# Patient Record
Sex: Male | Born: 1995 | Race: White | Hispanic: No | Marital: Single | Smoking: Never smoker
Health system: Southern US, Community
[De-identification: ages and names within clinical notes are randomized; demographics above are authoritative.]

## PROBLEM LIST (undated history)

## (undated) DIAGNOSIS — T7840XA Allergy, unspecified, initial encounter: Secondary | ICD-10-CM

## (undated) HISTORY — DX: Allergy, unspecified, initial encounter: T78.40XA

---

## 2014-09-09 ENCOUNTER — Ambulatory Visit (INDEPENDENT_AMBULATORY_CARE_PROVIDER_SITE_OTHER): Payer: Self-pay | Admitting: Urgent Care

## 2014-09-09 VITALS — BP 118/70 | HR 78 | Temp 98.1°F | Resp 18 | Ht 72.0 in | Wt 145.0 lb

## 2014-09-09 DIAGNOSIS — J029 Acute pharyngitis, unspecified: Secondary | ICD-10-CM

## 2014-09-09 DIAGNOSIS — R05 Cough: Secondary | ICD-10-CM

## 2014-09-09 DIAGNOSIS — J309 Allergic rhinitis, unspecified: Secondary | ICD-10-CM

## 2014-09-09 DIAGNOSIS — R059 Cough, unspecified: Secondary | ICD-10-CM

## 2014-09-09 MED ORDER — HYDROCODONE-HOMATROPINE 5-1.5 MG/5ML PO SYRP: 5.0000 mL | ORAL_SOLUTION | Freq: Every evening | ORAL | Status: DC | PRN

## 2014-09-09 MED ORDER — FLUTICASONE PROPIONATE 50 MCG/ACT NA SUSP: 2.0000 | Freq: Every day | NASAL | Status: DC

## 2014-09-09 NOTE — Progress Notes (Signed)
    MRN: [id number] DOB: February 26, 1996  Subjective:   Douglas Burke is a 19 y.o. male presenting for chief complaint of Sore Throat  Reports 2 week history of throat pain and mild dry cough, chest congestion. Has tried chloroseptic spray, cough drops, Alleve with minimal relief. Denies Sinus congestion, sinus pain, red eyes, ear fullness, ear pain, wheezing, shortness of breath, chest tightness and chest pain, fever, fatigue, nausea, vomiting, abdominal pain and diarrhea. Has had 2 sick contacts with cold like symptoms, they are now improved. Has a history of seasonal allergies, takes Claritin intermittently for this. Denies history of asthma. Patient did not take flu shot this season. Denies smoking, occasional alcohol drink. Denies any other aggravating or relieving factors, no other questions or concerns.  Douglas Burke has a current medication list which includes the following prescription(s): loratadine. He has No Known Allergies.  Douglas Burke  has a past medical history of Allergy. Also  has no past surgical history on file.  ROS As in subjective.  Objective:   Vitals: BP 118/70 mmHg  Pulse 78  Temp(Src) 98.1 F (36.7 C) (Oral)  Resp 18  Ht 6' (1.829 m)  Wt 145 lb (65.772 kg)  BMI 19.66 kg/m2  SpO2 98%  Physical Exam  Constitutional: He is well-developed, well-nourished, and in no distress.  HENT:  TM's intact bilaterally, no effusions or erythema. Nasal turbinates boggy. No sinus tenderness. Postnasal drip present, without oropharyngeal exudates, erythema or abscesses.   Eyes: Conjunctivae are normal. Right eye exhibits no discharge. Left eye exhibits no discharge. No scleral icterus.  Cardiovascular: Normal rate, regular rhythm and intact distal pulses.  Exam reveals no gallop and no friction rub.   No murmur heard. Pulmonary/Chest: No stridor. No respiratory distress. He has no wheezes. He has no rales. He exhibits no tenderness.  Lymphadenopathy:    He has no cervical  adenopathy.   Assessment and Plan :   1. Allergic rhinitis, unspecified allergic rhinitis type 2. Cough 3. Sore throat - suggested he restart his allergy medication, offered supportive care - Return to clinic in one week if symptoms don't improve or worsen  Wallis BambergMario Abby Stines, PA-C [facility] [phone] 09/09/2014 11:23 AM

## 2014-09-09 NOTE — Patient Instructions (Signed)

## 2015-08-15 ENCOUNTER — Encounter (HOSPITAL_COMMUNITY): Payer: Self-pay | Admitting: *Deleted

## 2015-08-15 ENCOUNTER — Emergency Department (HOSPITAL_COMMUNITY): Payer: Self-pay

## 2015-08-15 ENCOUNTER — Emergency Department (HOSPITAL_COMMUNITY)
Admission: EM | Admit: 2015-08-15 | Discharge: 2015-08-15 | Disposition: A | Discharge status: Home / Self Care | Payer: Self-pay | Attending: Emergency Medicine | Admitting: Emergency Medicine

## 2015-08-15 DIAGNOSIS — K920 Hematemesis: Secondary | ICD-10-CM | POA: Insufficient documentation

## 2015-08-15 DIAGNOSIS — R112 Nausea with vomiting, unspecified: Secondary | ICD-10-CM

## 2015-08-15 DIAGNOSIS — K922 Gastrointestinal hemorrhage, unspecified: Secondary | ICD-10-CM

## 2015-08-15 DIAGNOSIS — R109 Unspecified abdominal pain: Secondary | ICD-10-CM | POA: Insufficient documentation

## 2015-08-15 LAB — COMPREHENSIVE METABOLIC PANEL
ALBUMIN: 5.3 g/dL — AB (ref 3.5–5.0)
ALK PHOS: 44 U/L (ref 38–126)
ALT: 25 U/L (ref 17–63)
ANION GAP: 13 (ref 5–15)
AST: 26 U/L (ref 15–41)
BUN: 21 mg/dL — ABNORMAL HIGH (ref 6–20)
CHLORIDE: 104 mmol/L (ref 101–111)
CO2: 23 mmol/L (ref 22–32)
Calcium: 9.7 mg/dL (ref 8.9–10.3)
Creatinine, Ser: 1.07 mg/dL (ref 0.61–1.24)
GFR calc Af Amer: >60 mL/min (ref >60)
GFR calc non Af Amer: >60 mL/min (ref >60)
GLUCOSE: 134 mg/dL — AB (ref 65–99)
POTASSIUM: 4.2 mmol/L (ref 3.5–5.1)
SODIUM: 140 mmol/L (ref 135–145)
Total Bilirubin: 1.6 mg/dL — ABNORMAL HIGH (ref 0.3–1.2)
Total Protein: 9 g/dL — ABNORMAL HIGH (ref 6.5–8.1)

## 2015-08-15 LAB — OCCULT BLOOD GASTRIC / DUODENUM (SPECIMEN CUP): Occult Blood, Gastric: POSITIVE — AB

## 2015-08-15 LAB — CBC
HCT: 51.9 % (ref 39.0–52.0)
HCT: 50.1 % (ref 39.0–52.0)
HEMOGLOBIN: 17.3 g/dL — AB (ref 13.0–17.0)
Hemoglobin: 17.7 g/dL — ABNORMAL HIGH (ref 13.0–17.0)
MCH: 30.1 pg (ref 26.0–34.0)
MCH: 30.1 pg (ref 26.0–34.0)
MCHC: 34.1 g/dL (ref 30.0–36.0)
MCHC: 34.5 g/dL (ref 30.0–36.0)
MCV: 88.3 fL (ref 78.0–100.0)
MCV: 87.3 fL (ref 78.0–100.0)
PLATELETS: 216 10*3/uL (ref 150–400)
Platelets: 209 10*3/uL (ref 150–400)
RBC: 5.88 MIL/uL — ABNORMAL HIGH (ref 4.22–5.81)
RBC: 5.74 MIL/uL (ref 4.22–5.81)
RDW: 12.2 % (ref 11.5–15.5)
RDW: 12.2 % (ref 11.5–15.5)
WBC: 18.9 10*3/uL — AB (ref 4.0–10.5)
WBC: 20.4 10*3/uL — ABNORMAL HIGH (ref 4.0–10.5)

## 2015-08-15 LAB — TYPE AND SCREEN
ABO/RH(D): A POS
ANTIBODY SCREEN: NEGATIVE

## 2015-08-15 LAB — ABO/RH: ABO/RH(D): A POS

## 2015-08-15 MED ORDER — ONDANSETRON HCL 4 MG/2ML IJ SOLN
4.0000 mg | Freq: Once | INTRAMUSCULAR | Status: AC
  Administered 2015-08-15: 4 mg via INTRAVENOUS
  Filled 2015-08-15: qty 2

## 2015-08-15 MED ORDER — FAMOTIDINE IN NACL 20-0.9 MG/50ML-% IV SOLN
20.0000 mg | Freq: Once | INTRAVENOUS | Status: AC
  Administered 2015-08-15: 20 mg via INTRAVENOUS
  Filled 2015-08-15: qty 50

## 2015-08-15 MED ORDER — SODIUM CHLORIDE 0.9 % IV BOLUS (SEPSIS)
1000.0000 mL | Freq: Once | INTRAVENOUS | Status: AC
  Administered 2015-08-15: 1000 mL via INTRAVENOUS

## 2015-08-15 MED ORDER — PANTOPRAZOLE SODIUM 20 MG PO TBEC: 20.0000 mg | DELAYED_RELEASE_TABLET | Freq: Every day | ORAL | Status: AC

## 2015-08-15 MED ORDER — ONDANSETRON HCL 4 MG PO TABS: 4.0000 mg | ORAL_TABLET | Freq: Four times a day (QID) | ORAL | Status: AC

## 2015-08-15 MED ORDER — IOPAMIDOL (ISOVUE-300) INJECTION 61%
INTRAVENOUS | Status: AC
  Administered 2015-08-15: 100 mL
  Filled 2015-08-15: qty 100

## 2015-08-15 NOTE — ED Provider Notes (Signed)
Care assumed from Dr. Karma GanjaLinker. CT scan does not show any appendicitis or other acute pathology. No further vomiting in the ED. He is tolerating by mouth.  Vitals are stable. Abdomen soft and nontender. Patient is anxious to go home.  We'll start PPI. Advised to avoid alcohol, NSAIDs, caffeine. Establish care with PCP and GI. Return precautions discussed.  BP 114/62 mmHg  Pulse 53  Temp(Src) 97.7 F (36.5 C) (Oral)  Resp 11  Wt 142 lb (64.411 kg)  SpO2 96%   Glynn OctaveStephen Vasilisa Vore, MD 08/15/15 1131

## 2015-08-15 NOTE — ED Notes (Signed)
Provided PT with water and crackers

## 2015-08-15 NOTE — ED Notes (Signed)
Pt states he did not have blood in his BMs, just a mix of bright red and coffee ground emesis

## 2015-08-15 NOTE — ED Provider Notes (Signed)
CSN: [id number]     Arrival date & time 08/15/15  0236 History   First MD Initiated Contact with Patient 08/15/15 0500     Chief Complaint  Patient presents with  . GI Bleeding     (Consider location/radiation/quality/duration/timing/severity/associated sxs/prior Treatment) HPI  Pt presenting with c/o acute onset of vomiting- he states he vomited more than 10 times.  He saw coffe ground and bright red blood.  Currently in the ED he states he has continued to vomit and now there is no longer any blood in his vomit.  He states now the vomit appears more yellowish in color.  Some diarrhea- denies blood in stool to me, no melena.  C/o diffuse abdominal soreness.  No abdominal pain prior to vomiting.  Ate chicken alfredo tonight- friend with him ate the same and is not sick.  No fever/chills.  No fainting or dizziness.  No sick contacts.  He has not had hx of similar symptoms in the past.  No treatment prior to arrival.  There are no other associated systemic symptoms, there are no other alleviating or modifying factors.   Past Medical History  Diagnosis Date  . Allergy    History reviewed. No pertinent past surgical history. No family history on file. Social History  Substance Use Topics  . Smoking status: Never Smoker   . Smokeless tobacco: None  . Alcohol Use: 0.0 oz/week    0 Standard drinks or equivalent per week     Comment: occ    Review of Systems  ROS reviewed and all otherwise negative except for mentioned in HPI    Allergies  Review of patient's allergies indicates no known allergies.  Home Medications   Prior to Admission medications   Medication Sig Start Date End Date Taking? Authorizing Provider  ondansetron (ZOFRAN) 4 MG tablet Take 1 tablet (4 mg total) by mouth every 6 (six) hours. 08/15/15   Jerelyn Scott, MD  pantoprazole (PROTONIX) 20 MG tablet Take 1 tablet (20 mg total) by mouth daily. 08/15/15   Jerelyn Scott, MD   BP 107/62 mmHg  Pulse 78  Temp(Src) 97.7 F  (36.5 C) (Oral)  Resp 16  Wt 64.411 kg  SpO2 97%  Vitals reviewed Physical Exam  Physical Examination: General appearance - alert, well appearing, and in no distress Mental status - alert, oriented to person, place, and time Eyes - no conjunctival injection, no scleral icterus Mouth - mucous membranes moist, pharynx normal without lesions Chest - clear to auscultation, no wheezes, rales or rhonchi, symmetric air entry Heart - normal rate, regular rhythm, normal S1, S2, no murmurs, rubs, clicks or gallops Abdomen - soft, diffuse mild tenderness to palpation, no gaurding or rebound tenderness, nondistended, no masses or organomegaly Neurological - alert, oriented, normal speech Extremities - peripheral pulses normal, no pedal edema, no clubbing or cyanosis Skin - normal coloration and turgor, no rashes  ED Course  Procedures (including critical care time) Labs Review Labs Reviewed  COMPREHENSIVE METABOLIC PANEL - Abnormal; Notable for the following:    Glucose, Bld 134 (*)    BUN 21 (*)    Total Protein 9.0 (*)    Albumin 5.3 (*)    Total Bilirubin 1.6 (*)    All other components within normal limits  CBC - Abnormal; Notable for the following:    WBC 18.9 (*)    RBC 5.88 (*)    Hemoglobin 17.7 (*)    All other components within normal limits  CBC - Abnormal;  Notable for the following:    WBC 20.4 (*)    Hemoglobin 17.3 (*)    All other components within normal limits  OCCULT BLOOD GASTRIC / DUODENUM (SPECIMEN CUP) - Abnormal; Notable for the following:    Occult Blood, Gastric POSITIVE (*)    All other components within normal limits  POCT GASTRIC OCCULT BLOOD (1-CARD TO LAB)  TYPE AND SCREEN  ABO/RH    Imaging Review No results found. I have personally reviewed and evaluated these images and lab results as part of my medical decision-making.   EKG Interpretation None      MDM   Final diagnoses:  Nausea and vomiting, vomiting of unspecified type  Upper GI  bleed  Abdominal pain, unspecified abdominal location    Pt presenting with c/o acute onset of vomiting- saw some blood in vomit, then blood cleared.  He has had vomiting in the ED that is yellowish in color- no gross blood.    7:09 AM pt has been able to tolerate po fluids.  hgb maintained on recheck.  No further vomiting.  Will give rx for zofran and rx for pantoprazole.    7:44 AM on recheck pt had vomiting after his po fluids, abdomen is more tender, pt c/o periumblicial pain.  Will keep NPO for now- obtain abdominal CT scan.  Pt signed out to Dr. Manus Gunningancour pending abdominal CT, further treatment of emesis and reassesment.    Jerelyn ScottMartha Linker, MD 08/15/15 [phone]

## 2015-08-15 NOTE — ED Notes (Signed)
Pt is here and states that he has been throwing up blood tonite that was coffee ground material.  Pooped out blood too.  Pt reports abdominal pain.

## 2015-08-15 NOTE — Discharge Instructions (Signed)
Return to the ED with any concerns including vomiting and not able to keep down liquids, continued bleeding in vomit, worsening abdominal pain, fainting, dizziness, decreased level of alertness/lethargy, or any other alarming symptoms

## 2015-08-15 NOTE — ED Notes (Signed)
Pt vomited during assessment. Small amount, no visible blood noted.

## 2015-08-15 NOTE — ED Notes (Signed)
Pt taken to CT scan.

## 2017-06-18 IMAGING — CT CT ABD-PELV W/ CM
2 of 4 series · 16 of 46 positions shown, 18 images · IV contrast (Omni 300)
Comparison: None.

CLINICAL DATA: Generalized abdominal pain, vomiting, nausea.

EXAM:
CT ABDOMEN AND PELVIS WITH CONTRAST
TECHNIQUE: Multidetector CT imaging of the abdomen and pelvis was performed
using the standard protocol following bolus administration of
intravenous contrast.
CONTRAST:  100 mL JO1SF7-FHH IOPAMIDOL (JO1SF7-FHH) INJECTION

[Series 2: a/p w/ 5mm · axial · 0.71mm/px · z∈[+711,+1146]mm · 13 of 95 slices shown, 15 images]
[im 4/95  soft-tissue]
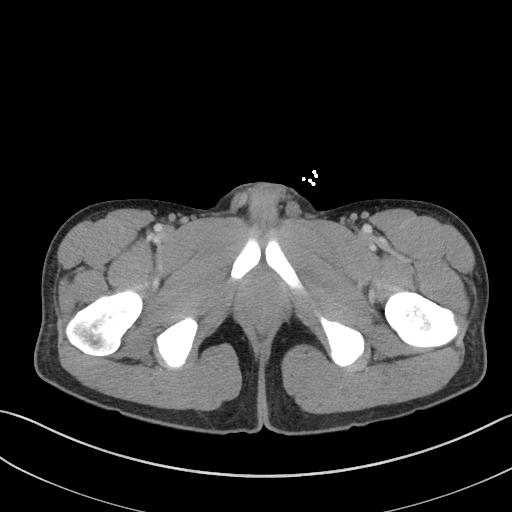
[im 4/95  bone]
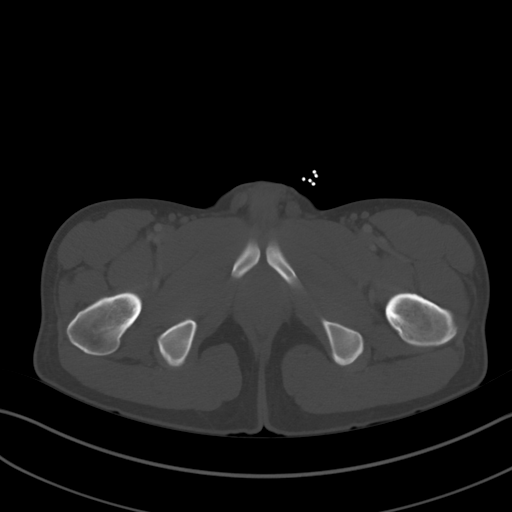
[im 12/95  soft-tissue]
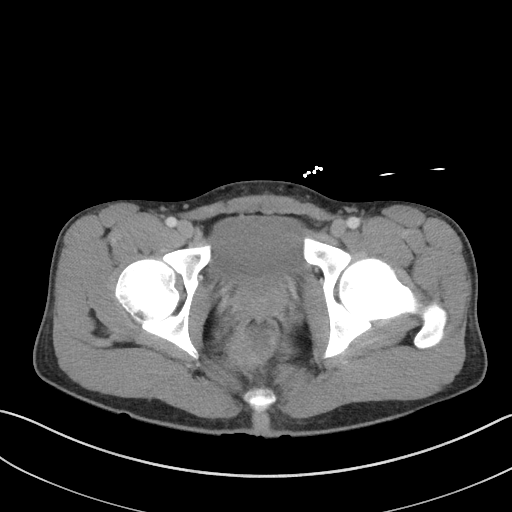
[im 20/95  soft-tissue]
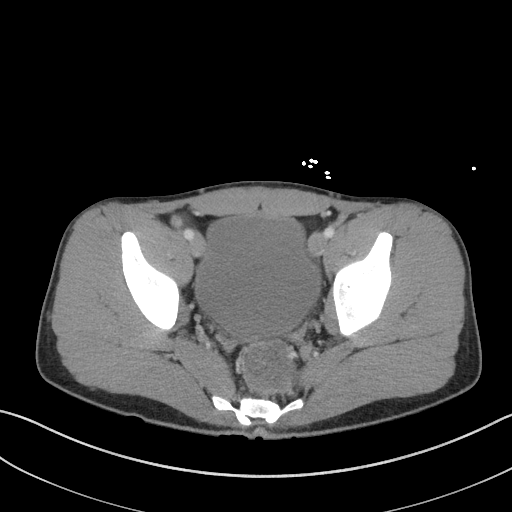
[im 28/95  soft-tissue]
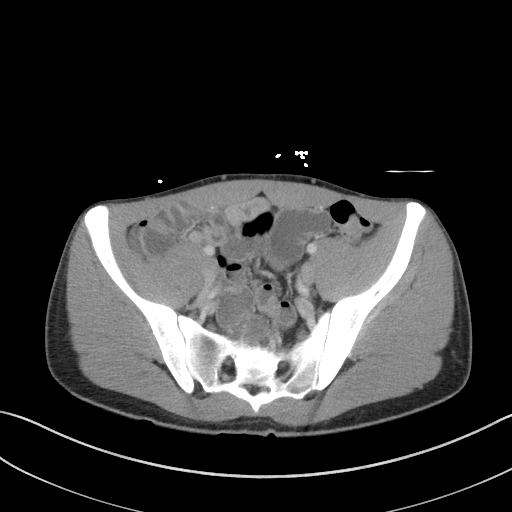
[im 32/95  soft-tissue]
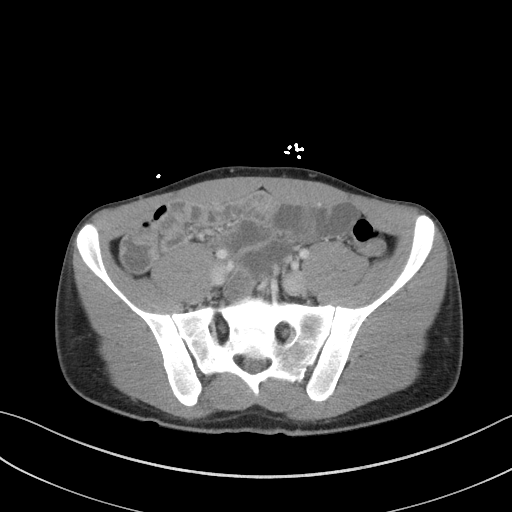
[im 40/95  soft-tissue]
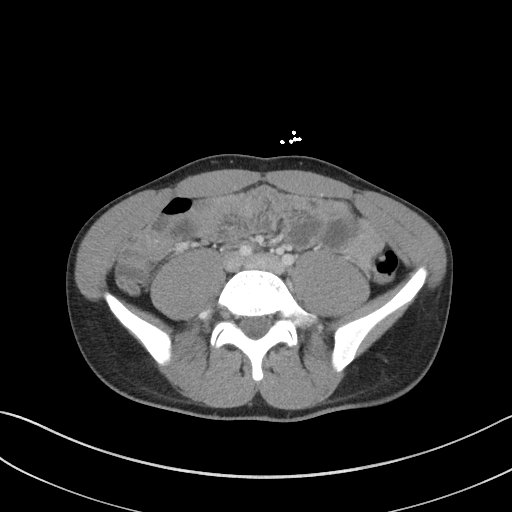
[im 48/95  soft-tissue]
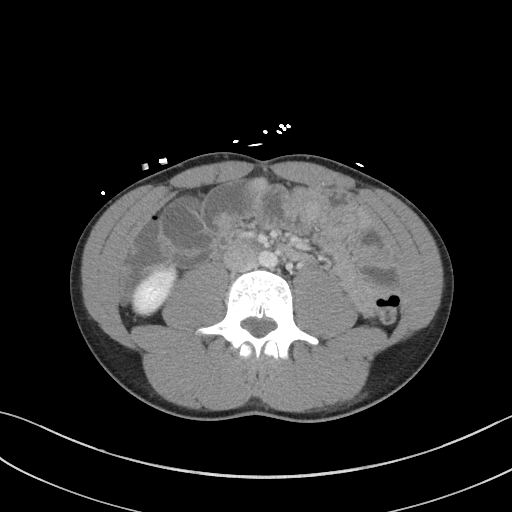
[im 55/95  soft-tissue]
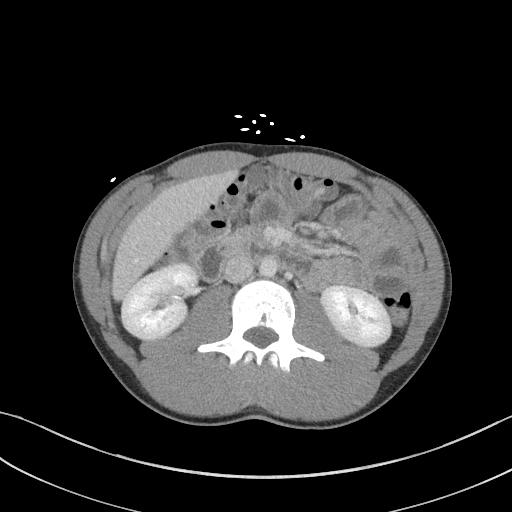
[im 63/95  soft-tissue]
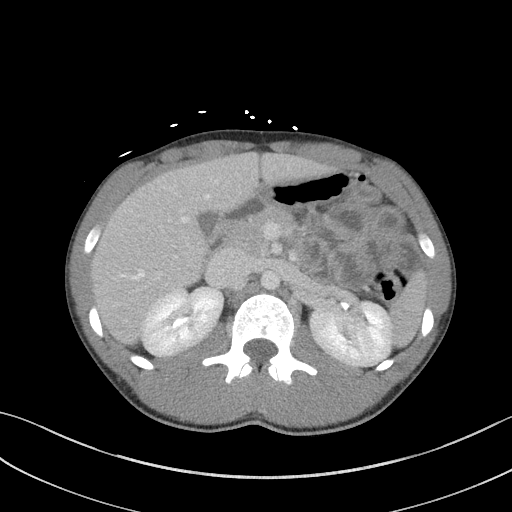
[im 63/95  bone]
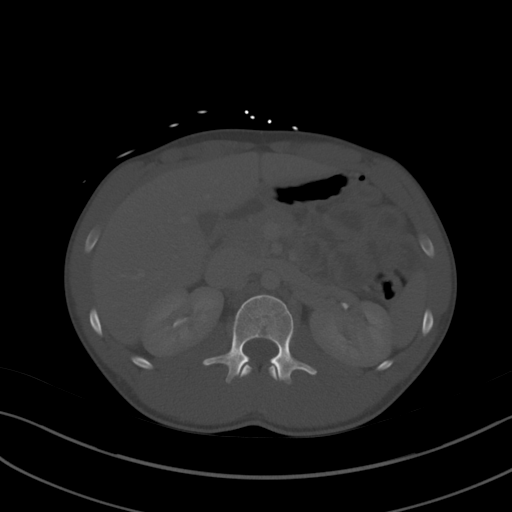
[im 67/95  soft-tissue]
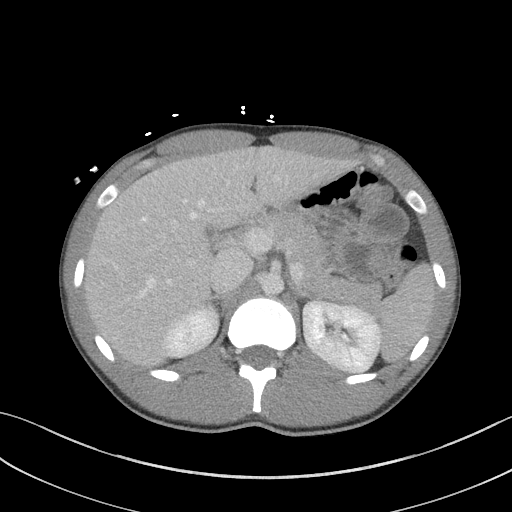
[im 75/95  soft-tissue]
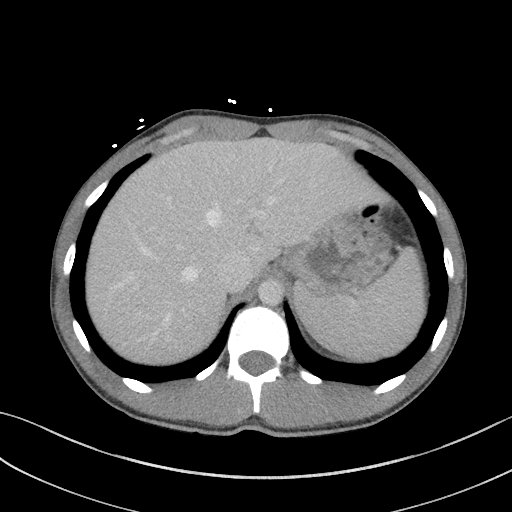
[im 83/95  soft-tissue]
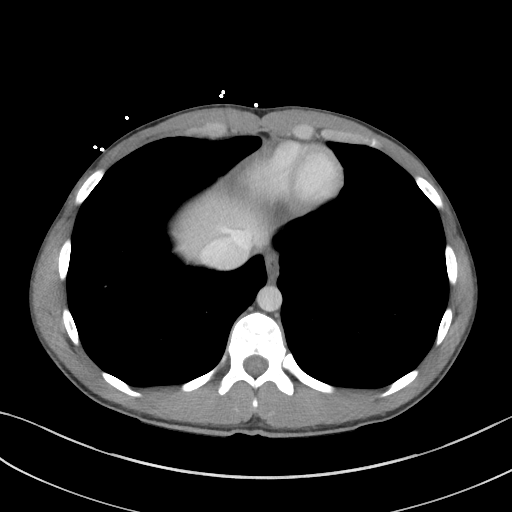
[im 91/95  soft-tissue]
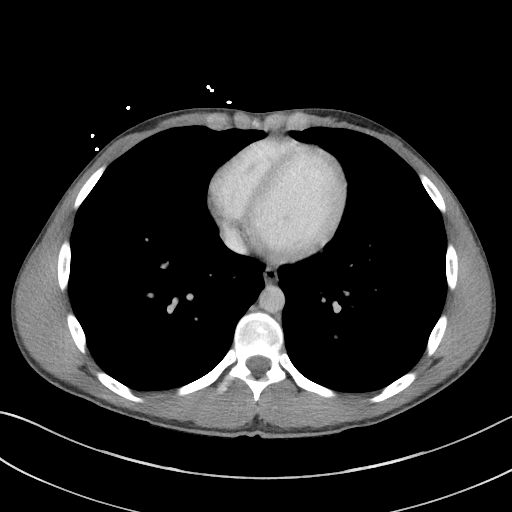

[Series 5: a/p w/ cor · coronal · 0.54mm/px · 3 of 116 slices shown]
[im 39/116  soft-tissue]
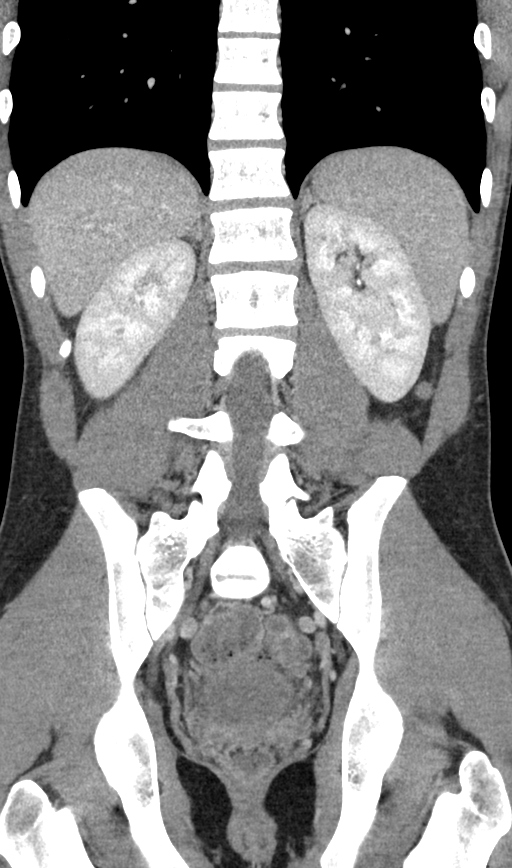
[im 52/116  soft-tissue]
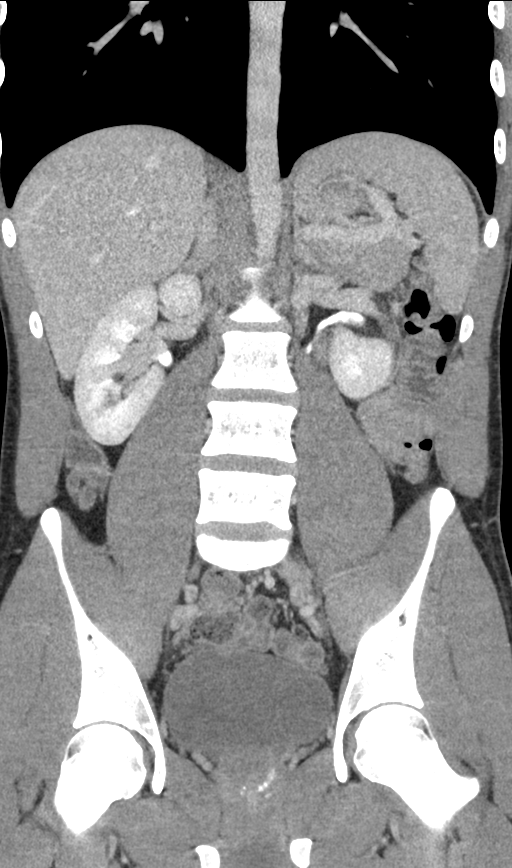
[im 64/116  soft-tissue]
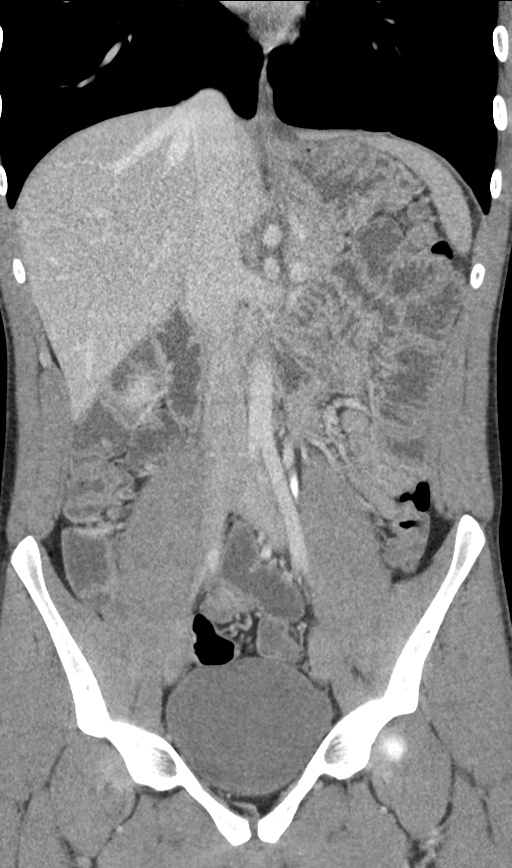

[16 of 46 positions shown; findings below may reference images not displayed]

FINDINGS: Visualized lung bases are unremarkable. No significant osseous
abnormality is noted.

No gallstones are noted. The liver, spleen and pancreas are
unremarkable. Adrenal glands and kidneys appear normal. No
hydronephrosis or renal obstruction is noted. The appendix appears
normal. There is no evidence of bowel obstruction. No abnormal fluid
collection is noted. Urinary bladder appears normal. No significant
adenopathy is noted.
IMPRESSION: No definite abnormality seen in the abdomen or pelvis.

## 2019-05-22 ENCOUNTER — Ambulatory Visit: Payer: BC Managed Care – PPO | Attending: Internal Medicine

## 2019-05-22 DIAGNOSIS — Z20822 Contact with and (suspected) exposure to covid-19: Secondary | ICD-10-CM

## 2019-05-24 LAB — NOVEL CORONAVIRUS, NAA: SARS-CoV-2, NAA: NOT DETECTED
